# Patient Record
Sex: Female | Born: 2000 | Race: Black or African American | Hispanic: No | Marital: Single | Smoking: Never smoker
Health system: Southern US, Community
[De-identification: ages and names within clinical notes are randomized; demographics above are authoritative.]

## PROBLEM LIST (undated history)

## (undated) DIAGNOSIS — J302 Other seasonal allergic rhinitis: Secondary | ICD-10-CM

## (undated) HISTORY — PX: TONSILLECTOMY: SUR1361

## (undated) HISTORY — PX: TONSILLECTOMY AND ADENOIDECTOMY: SHX28

---
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
## 2023-06-20 ENCOUNTER — Other Ambulatory Visit: Payer: Self-pay

## 2023-06-20 ENCOUNTER — Emergency Department (HOSPITAL_BASED_OUTPATIENT_CLINIC_OR_DEPARTMENT_OTHER): Payer: BC Managed Care – PPO

## 2023-06-20 ENCOUNTER — Emergency Department (HOSPITAL_BASED_OUTPATIENT_CLINIC_OR_DEPARTMENT_OTHER)
Admission: EM | Admit: 2023-06-20 | Discharge: 2023-06-20 | Disposition: A | Discharge status: Home / Self Care | Payer: BC Managed Care – PPO | Attending: Emergency Medicine | Admitting: Emergency Medicine

## 2023-06-20 ENCOUNTER — Encounter (HOSPITAL_BASED_OUTPATIENT_CLINIC_OR_DEPARTMENT_OTHER): Payer: Self-pay | Admitting: Pediatrics

## 2023-06-20 DIAGNOSIS — N946 Dysmenorrhea, unspecified: Secondary | ICD-10-CM | POA: Insufficient documentation

## 2023-06-20 DIAGNOSIS — R739 Hyperglycemia, unspecified: Secondary | ICD-10-CM | POA: Diagnosis not present

## 2023-06-20 DIAGNOSIS — R112 Nausea with vomiting, unspecified: Secondary | ICD-10-CM | POA: Diagnosis not present

## 2023-06-20 LAB — COMPREHENSIVE METABOLIC PANEL
ALT: 21 U/L (ref 0–44)
AST: 17 U/L (ref 15–41)
Albumin: 4.7 g/dL (ref 3.5–5.0)
Alkaline Phosphatase: 55 U/L (ref 38–126)
Anion gap: 8 (ref 5–15)
BUN: 13 mg/dL (ref 6–20)
CO2: 25 mmol/L (ref 22–32)
Calcium: 9.9 mg/dL (ref 8.9–10.3)
Chloride: 105 mmol/L (ref 98–111)
Creatinine, Ser: 0.76 mg/dL (ref 0.44–1.00)
GFR, Estimated: >60 mL/min (ref >60)
Glucose, Bld: 103 mg/dL — ABNORMAL HIGH (ref 70–99)
Potassium: 3.9 mmol/L (ref 3.5–5.1)
Sodium: 138 mmol/L (ref 135–145)
Total Bilirubin: 0.5 mg/dL (ref 0.3–1.2)
Total Protein: 8 g/dL (ref 6.5–8.1)

## 2023-06-20 LAB — URINALYSIS, ROUTINE W REFLEX MICROSCOPIC
Bilirubin Urine: NEGATIVE
Glucose, UA: NEGATIVE mg/dL
Ketones, ur: NEGATIVE mg/dL
Nitrite: NEGATIVE
Protein, ur: 100 mg/dL — AB
Specific Gravity, Urine: >=1.03 (ref 1.005–1.030)
pH: 5.5 (ref 5.0–8.0)

## 2023-06-20 LAB — LIPASE, BLOOD: Lipase: 36 U/L (ref 11–51)

## 2023-06-20 LAB — CBC
HCT: 38.2 % (ref 36.0–46.0)
Hemoglobin: 13.4 g/dL (ref 12.0–15.0)
MCH: 29.1 pg (ref 26.0–34.0)
MCHC: 35.1 g/dL (ref 30.0–36.0)
MCV: 82.9 fL (ref 80.0–100.0)
Platelets: 241 10*3/uL (ref 150–400)
RBC: 4.61 MIL/uL (ref 3.87–5.11)
RDW: 12.7 % (ref 11.5–15.5)
WBC: 6.5 10*3/uL (ref 4.0–10.5)
nRBC: 0 % (ref 0.0–0.2)

## 2023-06-20 LAB — URINALYSIS, MICROSCOPIC (REFLEX): RBC / HPF: >50 RBC/hpf (ref 0–5)

## 2023-06-20 LAB — PREGNANCY, URINE: Preg Test, Ur: NEGATIVE

## 2023-06-20 MED ORDER — KETOROLAC TROMETHAMINE 15 MG/ML IJ SOLN: 15.0000 mg | Freq: Once | INTRAMUSCULAR | Status: DC

## 2023-06-20 MED ORDER — IBUPROFEN 600 MG PO TABS: 600.0000 mg | ORAL_TABLET | Freq: Four times a day (QID) | ORAL | 0 refills | Status: AC | PRN

## 2023-06-20 MED ORDER — ONDANSETRON HCL 4 MG PO TABS: 4.0000 mg | ORAL_TABLET | Freq: Four times a day (QID) | ORAL | 0 refills | Status: AC | PRN

## 2023-06-20 MED ORDER — KETOROLAC TROMETHAMINE 15 MG/ML IJ SOLN
15.0000 mg | Freq: Once | INTRAMUSCULAR | Status: AC
  Administered 2023-06-20: 15 mg via INTRAMUSCULAR
  Filled 2023-06-20: qty 1

## 2023-06-20 MED ORDER — ONDANSETRON HCL 4 MG PO TABS: 4.0000 mg | ORAL_TABLET | Freq: Four times a day (QID) | ORAL | 0 refills | Status: DC

## 2023-06-20 NOTE — Discharge Instructions (Addendum)
You were seen in the emergency department today for evaluation of your menstrual cramps.  I am glad that you are feeling better after the medication.  I recommend taking 600 mg of ibuprofen every 6 hours as needed for pain.  I will send you home with some nausea medication as needed as well.  Please make sure you are drinking plenty of fluids and staying well-hydrated.  The ibuprofen should help with your cramps.  You can also take 1000 mg of Tylenol every 6 hours in addition to this.  Additionally, I would like for you to follow-up with your gynecologist for further evaluation on your menstrual cycles.  If you have any concerns with new or worsening symptoms, please return to your nearest Emergency Department for evaluation.  Contact a doctor if: You have pain that gets worse. You have pain that does not get better with medicine. You have pain during sex. You feel like you may vomit or you vomit during your period and medicine does not help. Get help right away if: You faint.

## 2023-06-20 NOTE — ED Triage Notes (Signed)
C/O abdominal pain and NV; states she does get some N/V in the beginning of her cycle usually but this persisted few days afterwards.

## 2023-06-20 NOTE — ED Notes (Signed)
ED Provider at bedside. 

## 2023-06-20 NOTE — ED Notes (Signed)
Pt. Reports she started her Cycle on Sunday.  Pt. Is having lower abd. Pain and reports more bleeding than normal.  Pt. Reports she feels more cramping also.  She reports she vomited this morning.  She has had nausea.

## 2023-06-20 NOTE — ED Provider Notes (Signed)
[facility] Provider Note   CSN: [id number] Arrival date & time: 06/20/23  1159     History Chief Complaint  Patient presents with   Abdominal Pain    Allison Benson is a 22 y.o. female reportedly otherwise healthy presents emerged from today for evaluation of nausea, vomiting, menstrual cramps.  Patient reports that she does occasionally get nausea and vomiting with her menstrual cycles however is only on the first day.  She reports that this is going into day 3.  She has had 1 episode of emesis.  Not black or bloody.  Reports that her menstrual bleeding is the same as it was previously, around 2 pads per day.  She reports that she usually just has cramps in the first day however these have persisted until the third day.  She has not taken any medications for her symptoms.  She denies any dysuria, fever, chills, or concern for any STDs.  No known drug allergies.  Denies any medical or surgical history.  Denies any tobacco, EtOH or illicit drug use.   Abdominal Pain Associated symptoms: nausea, vaginal bleeding and vomiting   Associated symptoms: no chills, no constipation, no diarrhea, no dysuria and no fever        Home Medications Prior to Admission medications   Medication Sig Start Date End Date Taking? Authorizing Provider  cetirizine (ZYRTEC) 5 MG tablet Take 5 mg by mouth daily.    [provider]  methocarbamol (ROBAXIN) 500 MG tablet Take 1 tablet (500 mg total) by mouth every 12 (twelve) hours as needed for muscle spasms. 03/11/20   Cammy Copa, MD      Allergies    Patient has no known allergies.    Review of Systems   Review of Systems  Constitutional:  Negative for chills and fever.  Gastrointestinal:  Positive for nausea and vomiting. Negative for blood in stool, constipation and diarrhea.  Genitourinary:  Positive for vaginal bleeding. Negative for dysuria.    Physical Exam Updated Vital Signs BP  121/81   Pulse 65   Temp 98.2 F (36.8 C) (Oral)   Resp 17   Ht 5\' 11"  (1.803 m)   Wt 117.9 kg   LMP 06/18/2023   SpO2 100%   BMI 36.26 kg/m  Physical Exam Vitals and nursing note reviewed.  Constitutional:      General: She is not in acute distress.    Appearance: She is not ill-appearing or toxic-appearing.  Eyes:     General: No scleral icterus. Cardiovascular:     Rate and Rhythm: Normal rate.  Pulmonary:     Effort: Pulmonary effort is normal. No respiratory distress.  Abdominal:     General: There is no distension.     Palpations: Abdomen is soft.     Tenderness: There is no abdominal tenderness.     Comments: Abdomen soft, nontender.  Genitourinary:    Comments: Patient declined Skin:    General: Skin is warm and dry.  Neurological:     General: No focal deficit present.     Mental Status: She is alert.     ED Results / Procedures / Treatments   Labs (all labs ordered are listed, but only abnormal results are displayed) Labs Reviewed  COMPREHENSIVE METABOLIC PANEL - Abnormal; Notable for the following components:      Result Value   Glucose, Bld 103 (*)    All other components within normal limits  URINALYSIS, ROUTINE W  REFLEX MICROSCOPIC - Abnormal; Notable for the following components:   APPearance CLOUDY (*)    Hgb urine dipstick LARGE (*)    Protein, ur 100 (*)    Leukocytes,Ua TRACE (*)    All other components within normal limits  URINALYSIS, MICROSCOPIC (REFLEX) - Abnormal; Notable for the following components:   Bacteria, UA FEW (*)    All other components within normal limits  LIPASE, BLOOD  CBC  PREGNANCY, URINE    EKG None  Radiology US PELVIC COMPLETE W TRANSVAGINAL AND TORSION R/O  Result Date: 06/20/2023 CLINICAL DATA:  Pelvic cramping. EXAM: TRANSABDOMINAL AND TRANSVAGINAL ULTRASOUND OF PELVIS DOPPLER ULTRASOUND OF OVARIES TECHNIQUE: Both transabdominal and transvaginal ultrasound examinations of the pelvis were performed.  Transabdominal technique was performed for global imaging of the pelvis including uterus, ovaries, adnexal regions, and pelvic cul-de-sac. It was necessary to proceed with endovaginal exam following the transabdominal exam to visualize the uterus, endometrium, ovaries, and adnexa. Color and duplex Doppler ultrasound was utilized to evaluate blood flow to the ovaries. COMPARISON:  Pelvic ultrasound dated May 14, 2017. FINDINGS: Uterus Measurements: 7.4 x 3.6 x 4.2 cm = volume: 58 mL. No fibroids or other mass visualized. Endometrium Thickness: 9 mm.  No focal abnormality visualized. Right ovary Measurements: 6.4 x 1.8 x 2.1 cm = volume: 12.6 mL. Normal appearance/no adnexal mass. Left ovary Not visualized. Pulsed Doppler evaluation of the right ovary demonstrates normal low-resistance arterial and venous waveforms. Other findings Trace free fluid in the pelvis, likely physiologic. IMPRESSION: 1. Nonvisualization of the left ovary. Otherwise normal pelvic ultrasound. Electronically Signed   By: Obie Dredge M.D.   On: 06/20/2023 15:58    Procedures Procedures   Medications Ordered in ED Medications  ketorolac (TORADOL) 15 MG/ML injection 15 mg (15 mg Intramuscular Given 06/20/23 1459)    ED Course/ Medical Decision Making/ A&P   Medical Decision Making Amount and/or Complexity of Data Reviewed Labs: ordered. Radiology: ordered.  Risk Prescription drug management.   22 y.o. female presents to the ER for evaluation of nausea and vomiting with menstrual cycle as well as prolonged menstrual cramps. Differential diagnosis includes but is not limited to dysmenorrhea, electrolyte abnormality, GYN etiology. Vital signs mildly increased blood pressure otherwise unremarkable. Physical exam as noted above.   Will order her some Toradol for her symptoms.  Patient currently not feeling nauseous.  I independently reviewed and interpreted the patient's labs.  Lipase within normal limits.  CMP shows mildly  elevated glucose at 103 otherwise no electrolyte or LFT abnormality.  CBC without leukocytosis or anemia.  Pregnancy test is negative.  Urinalysis shows cloudy urine with large amount of hemoglobin 100 protein.  There is trace amount of leukocytes present with few bacteria but greater than 50 red blood cells.  Patient is not having any dysuria or any urgency or frequency.  Likely contaminated from her menstrual cycle blood.  US shows 1. Nonvisualization of the left ovary. Otherwise normal pelvic ultrasound.  On reevaluation, patient reports that she is no longer experiencing menstrual cramps from the Toradol.  Will prescribe her some Zofran as needed for the nausea vomiting.  I discussed with her to discuss to see her gynecologist for her nausea and vomiting that she gets with her menstrual cycles.  I have a very low suspicion for any ovarian torsion as she does not have any abdominal tenderness palpation, she does not appear to be in any acute distress, her main concern was the nausea vomiting happening on the third  day instead of on the first day that it usually does.  The patient has vomited 1 time.  She does not appear dehydrated.  No significant electrolyte abnormality.  The patient declined a pelvic exam.  She reports that she had does not have any concern for any STDs and is not currently sexually active.  Question PID, explained to her that pelvic exam will help me rule this out however the patient declines that she reports she does not have any concern for this.  Will discharge home with some Zofran and recommended 60 mg of ibuprofen every 6 hours to help with menstrual cramps.  We discussed the results of the labs/imaging. The plan is to do medication as prescribed, follow-up with gynecology. We discussed strict return precautions and red flag symptoms. The patient verbalized their understanding and agrees to the plan. The patient is stable and being discharged home in good condition.  Portions of  this report may have been transcribed using voice recognition software. Every effort was made to ensure accuracy; however, inadvertent computerized transcription errors may be present.   Final Clinical Impression(s) / ED Diagnoses Final diagnoses:  Dysmenorrhea    Rx / DC Orders ED Discharge Orders          Ordered    ibuprofen (ADVIL) 600 MG tablet  Every 6 hours PRN        06/20/23 1624    ondansetron (ZOFRAN) 4 MG tablet  Every 6 hours,   Status:  Discontinued        06/20/23 1624    ondansetron (ZOFRAN) 4 MG tablet  Every 6 hours PRN        06/20/23 1624              Achille Rich, PA-C 06/22/23 0813    Ernie Avena, MD 06/26/23 1454
